# Patient Record
Sex: Male | Born: 2000 | Race: White | Hispanic: No | Marital: Single | Smoking: Never smoker
Health system: Southern US, Community
[De-identification: ages and names within clinical notes are randomized; demographics above are authoritative.]

## PROBLEM LIST (undated history)

## (undated) DIAGNOSIS — I491 Atrial premature depolarization: Secondary | ICD-10-CM

## (undated) DIAGNOSIS — I493 Ventricular premature depolarization: Secondary | ICD-10-CM

## (undated) HISTORY — DX: Atrial premature depolarization: I49.1

## (undated) HISTORY — DX: Ventricular premature depolarization: I49.3

---
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
## 2019-03-04 IMAGING — CR DG KNEE COMPLETE 4+V*R*
4 series · 4 of 4 positions shown · non-contrast
Comparison: None.

CLINICAL DATA: Knee pain after collision injury playing soccer.

EXAM:
RIGHT KNEE - COMPLETE 4+ VIEW

[t knee ap right]
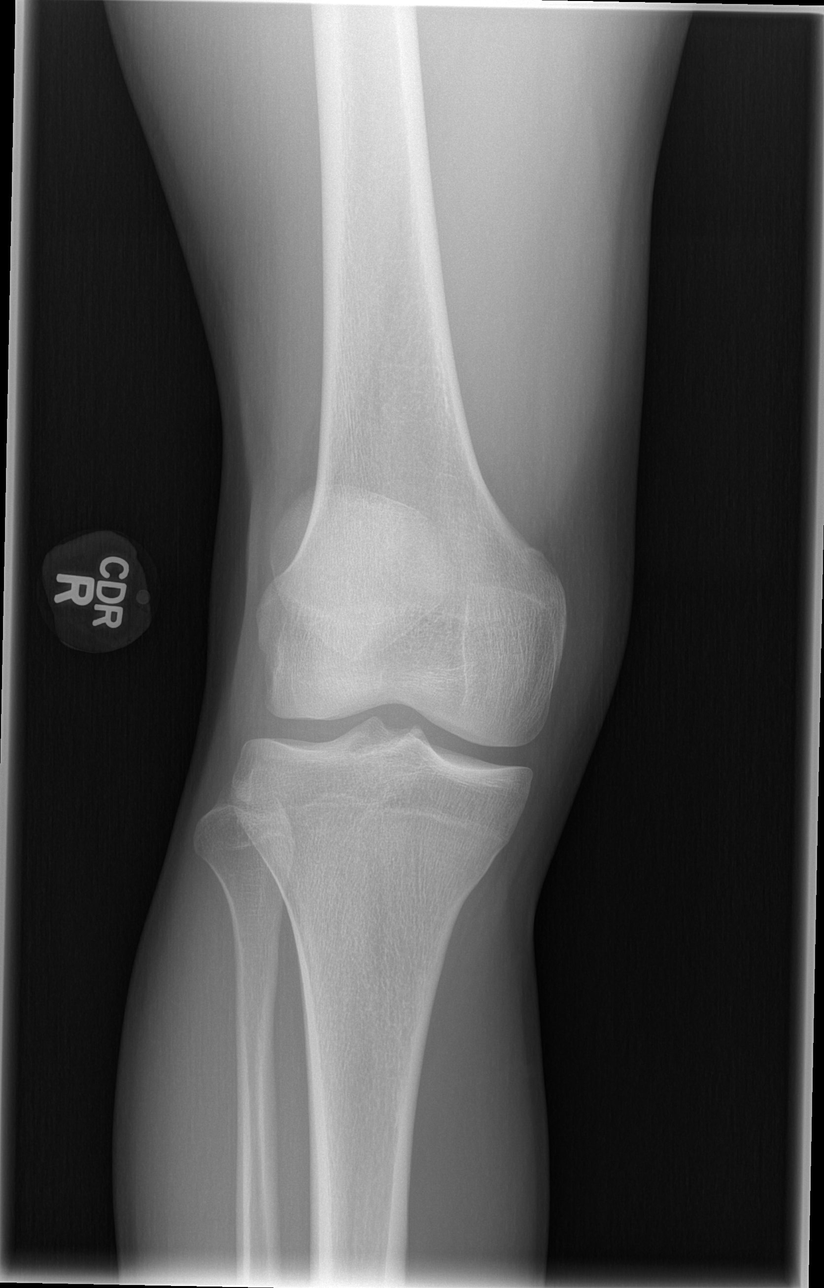

[t knee oblique right (1 of 2)]
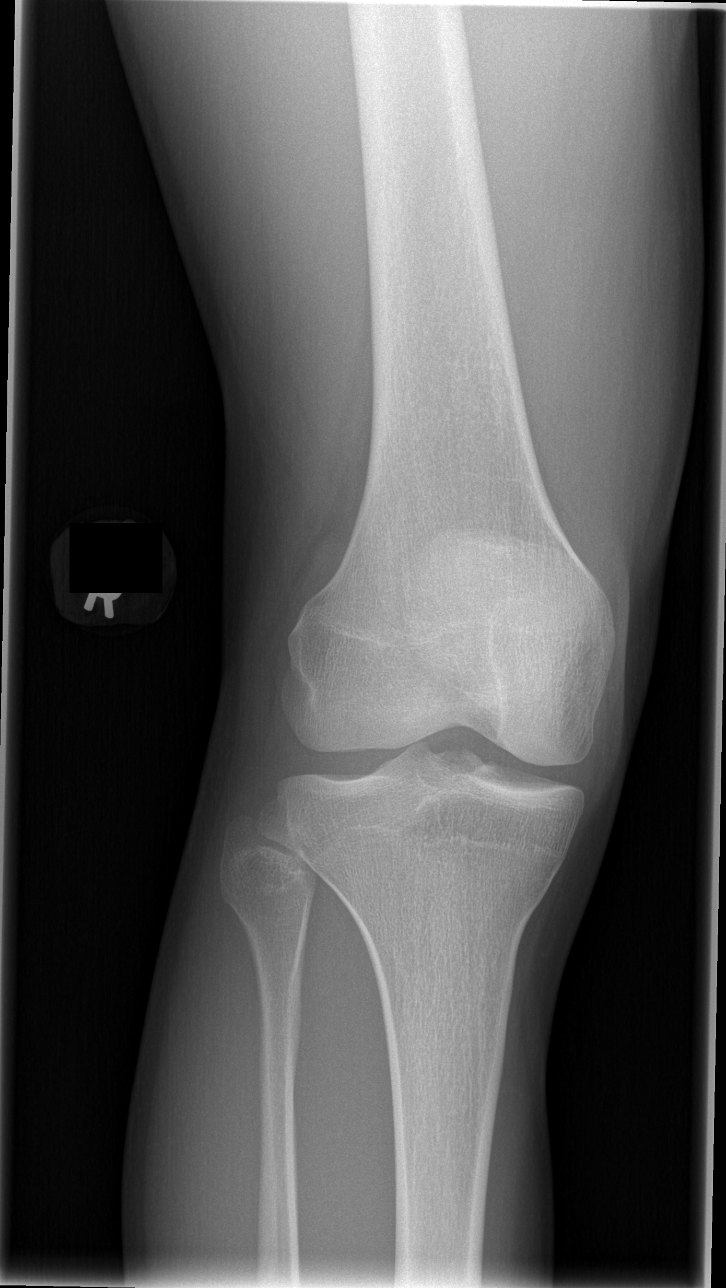

[t knee oblique right (2 of 2)]
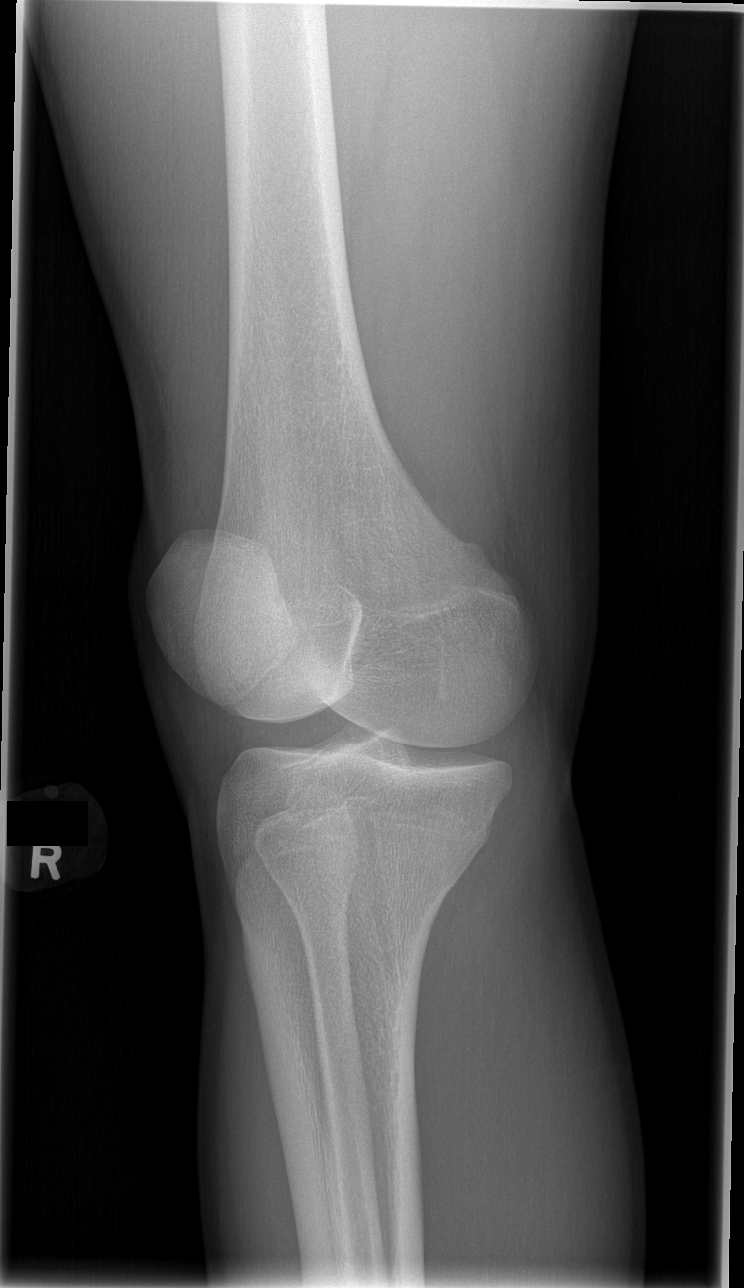

[t knee lat right]
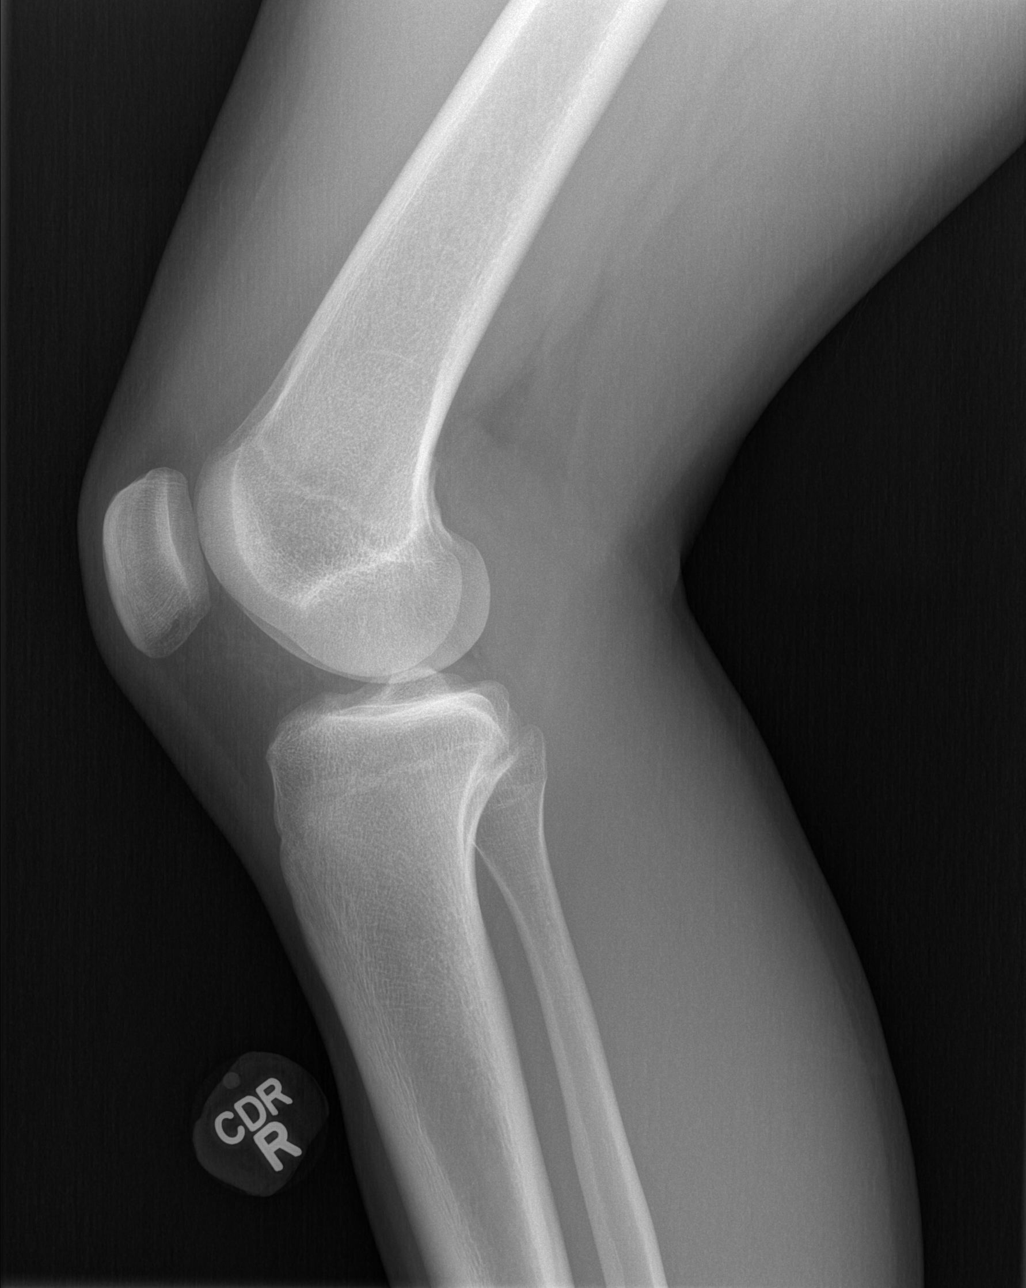

[4 of 4 positions shown; findings below may reference images not displayed]

FINDINGS: No evidence of fracture, dislocation, or joint effusion. No evidence
of arthropathy or other focal bone abnormality. Soft tissues are
unremarkable.
IMPRESSION: Negative.

## 2024-01-14 DIAGNOSIS — Z23 Encounter for immunization: Secondary | ICD-10-CM | POA: Diagnosis not present

## 2024-01-14 DIAGNOSIS — Z131 Encounter for screening for diabetes mellitus: Secondary | ICD-10-CM | POA: Diagnosis not present

## 2024-01-14 DIAGNOSIS — Z0000 Encounter for general adult medical examination without abnormal findings: Secondary | ICD-10-CM | POA: Diagnosis not present
